# Patient Record
Sex: Female | Born: 1970 | Race: White | Hispanic: No | Marital: Single | Smoking: Never smoker
Health system: Southern US, Community
[De-identification: ages and names within clinical notes are randomized; demographics above are authoritative.]

## PROBLEM LIST (undated history)

## (undated) DIAGNOSIS — J45909 Unspecified asthma, uncomplicated: Secondary | ICD-10-CM

## (undated) DIAGNOSIS — T783XXA Angioneurotic edema, initial encounter: Secondary | ICD-10-CM

## (undated) HISTORY — PX: OTHER SURGICAL HISTORY: SHX169

## (undated) HISTORY — DX: Unspecified asthma, uncomplicated: J45.909

## (undated) HISTORY — DX: Angioneurotic edema, initial encounter: T78.3XXA

---

## 2014-11-17 ENCOUNTER — Encounter: Payer: Self-pay | Admitting: Allergy and Immunology

## 2014-11-17 ENCOUNTER — Ambulatory Visit (INDEPENDENT_AMBULATORY_CARE_PROVIDER_SITE_OTHER): Payer: BLUE CROSS/BLUE SHIELD | Admitting: Allergy and Immunology

## 2014-11-17 ENCOUNTER — Ambulatory Visit: Payer: Self-pay | Admitting: Allergy and Immunology

## 2014-11-17 VITALS — BP 112/74 | HR 68 | Temp 98.1°F | Resp 14 | Ht 62.21 in | Wt 229.7 lb

## 2014-11-17 DIAGNOSIS — J4540 Moderate persistent asthma, uncomplicated: Secondary | ICD-10-CM | POA: Diagnosis not present

## 2014-11-17 DIAGNOSIS — K219 Gastro-esophageal reflux disease without esophagitis: Secondary | ICD-10-CM

## 2014-11-17 DIAGNOSIS — J3089 Other allergic rhinitis: Secondary | ICD-10-CM | POA: Diagnosis not present

## 2014-11-17 MED ORDER — RANITIDINE HCL 300 MG PO TABS: 300.0000 mg | ORAL_TABLET | Freq: Every day | ORAL | Status: AC

## 2014-11-17 MED ORDER — ALBUTEROL SULFATE (2.5 MG/3ML) 0.083% IN NEBU
2.5000 mg | INHALATION_SOLUTION | Freq: Once | RESPIRATORY_TRACT | Status: AC
  Administered 2014-11-17: 2.5 mg via RESPIRATORY_TRACT

## 2014-11-17 MED ORDER — MOMETASONE FUROATE 200 MCG/ACT IN AERO: 2.0000 | INHALATION_SPRAY | Freq: Two times a day (BID) | RESPIRATORY_TRACT | Status: AC

## 2014-11-17 MED ORDER — MONTELUKAST SODIUM 10 MG PO TABS: 10.0000 mg | ORAL_TABLET | Freq: Every day | ORAL | Status: AC

## 2014-11-17 NOTE — Progress Notes (Signed)
[facility] [place]  New patient note    Subjective:   Natasha Stone is a 44 y.o. female who returns to the [facility] in re-evaluation of the following:  HPI Comments:  Natasha Stone has a history of developing and being evaluated for chest pain in August 2016. She apparently had a negative diagnostic evaluation and was started on therapy for asthma given her history of constant cough which has been an issue for year. Neither Dulera or a SABA has helped her with these issue. She complains about a dry cough and getting SOB if she exerts herself. Very little throat clearing or raspy voice. She has reflux up to her throat even on Nexium. Sometimes she needs to take Nexium twice a day for breakthrough symptoms. She drink caffeine daily. As well, she tells me that her oxygen level was very low when she was in the hospital and she was discharged with an O2 of 88% she has very restless sleep and wakes up all night and feels tired all day. She has a runny nose especially in the morning but no nasal congestion or sneezing or anosmia or headaches.    Current outpatient prescriptions:  .  albuterol (VENTOLIN HFA) 108 (90 BASE) MCG/ACT inhaler, Inhale 2 puffs into the lungs every 4 (four) hours as needed for wheezing or shortness of breath (PT IS USING AT LEAST TWICE A DAY)., Disp: , Rfl:  .  esomeprazole (NEXIUM) 40 MG capsule, Take 40 mg by mouth daily at 12 noon., Disp: , Rfl:  .  Mometasone Furoate (ASMANEX HFA) 200 MCG/ACT AERO, Inhale 2 puffs into the lungs 2 (two) times daily., Disp: 1 Inhaler, Rfl: 3 .  mometasone-formoterol (DULERA) 200-5 MCG/ACT AERO, Inhale 2 puffs into the lungs daily., Disp: , Rfl:  .  montelukast (SINGULAIR) 10 MG tablet, Take 1 tablet (10 mg total) by mouth at bedtime., Disp: 30 tablet, Rfl: 5 .  PROAIR HFA 108 (90 BASE) MCG/ACT inhaler, Inhale 2 puffs into the lungs 2 (two) times daily as needed., Disp: , Rfl:  0 .  ranitidine (ZANTAC) 300 MG tablet, Take 1 tablet (300 mg total) by mouth at bedtime., Disp: 30 tablet, Rfl: 5  Meds ordered this encounter  Medications  . albuterol (PROVENTIL) (2.5 MG/3ML) 0.083% nebulizer solution 2.5 mg    Sig:   . montelukast (SINGULAIR) 10 MG tablet    Sig: Take 1 tablet (10 mg total) by mouth at bedtime.    Dispense:  30 tablet    Refill:  5  . Mometasone Furoate (ASMANEX HFA) 200 MCG/ACT AERO    Sig: Inhale 2 puffs into the lungs 2 (two) times daily.    Dispense:  1 Inhaler    Refill:  3  . ranitidine (ZANTAC) 300 MG tablet    Sig: Take 1 tablet (300 mg total) by mouth at bedtime.    Dispense:  30 tablet    Refill:  5    Past Medical History  Diagnosis Date  . Angio-edema   . Asthma     Past Surgical History  Procedure Laterality Date  . 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 Morphine And Related Nausea And Vomiting    Review of Systems  Constitutional: Negative for fever, chills, weight loss and malaise/fatigue.  HENT: Positive for congestion. Negative for ear discharge, ear pain, hearing loss, nosebleeds, sore throat and tinnitus.  Eyes: Negative for blurred vision, pain, discharge and redness.  Respiratory: Positive for cough. Negative for hemoptysis, sputum production, shortness of breath, wheezing and stridor.   Cardiovascular: Negative for chest pain, palpitations and leg swelling.  Gastrointestinal: Positive for heartburn. Negative for nausea, vomiting, abdominal pain and diarrhea.  Genitourinary: Negative for dysuria.  Musculoskeletal: Negative for myalgias and joint pain.  Skin: Negative for itching and rash.  Neurological: Negative for dizziness and headaches.     Objective:   Filed Vitals:   11/17/14 1339  BP: 112/74  Pulse: 68  Temp: 98.1 F (36.7 C)  Resp: 14    Physical Exam  Constitutional: She is well-developed, well-nourished, and in no distress. No distress.  HENT:   Head: Normocephalic.  Right Ear: External ear normal.  Left Ear: External ear normal.  Nose: Nose normal.  Mouth/Throat: Oropharynx is clear and moist. No oropharyngeal exudate.  Eyes: Conjunctivae are normal. Pupils are equal, round, and reactive to light. Right eye exhibits no discharge. Left eye exhibits no discharge. No scleral icterus.  Neck: No tracheal deviation present. No thyromegaly present.  Cardiovascular: Normal rate, regular rhythm and normal heart sounds.  Exam reveals no gallop and no friction rub.   No murmur heard. Pulmonary/Chest: Effort normal and breath sounds normal. No respiratory distress. She has no wheezes. She has no rales. She exhibits no tenderness.  Abdominal: Soft. She exhibits no distension and no mass. There is no tenderness. There is no rebound and no guarding.  Musculoskeletal: She exhibits no edema or tenderness.  Lymphadenopathy:    She has no cervical adenopathy.  Neurological: She is alert. Gait normal.  Skin: No rash noted. She is not diaphoretic. No erythema. No pallor.  Psychiatric: Mood and affect normal.    Diagnostics:   Allergy skin tests were negative.   Spirometry was performed and demonstrated an FEV1 of 2.04 at 77 % of predicted. Following nebulized albuterol her FEVI rose 4%  The patient had an Asthma Control Test with the following results: ACT Total Score: 24.    SaO2 was 95% on room air  Assessment and Plan:   1. Moderate persistent asthma, uncomplicated   2. Other allergic rhinitis   3. Gastroesophageal reflux disease, esophagitis presence not specified        1. Allergen avoidance measures  2. Treat and prevent inflammation:   A. Montelukast 10mg  one tablet one time per day  B. OTC Rhinocort one spray each nostril one time per day  C. Asmanex 200 two inhalations two times per day (spacer)  3. Treat reflux:   A. Nexium 40 one tablet one time per ady in AM  B. Ranitidine 300mg  one tablet one time per day in  PM  C. Consolidate caffeine use slowly  4. If needed:   A. Ventolin 2 puffs every 4-6 hours if needed.  B. OTC antihistamine  5. Sleep study???  6. Return in three weeks.  7. Get a flu vaccine  8. Review Chest X-ray   Natasha Stone will use the plan above for three weeks and we make a decision about how to proceed pending her response. I suspect the biggest insult to her airway may be reflux. I do not know why her O2 was low during her hospitalization but given some of her other issues there may be a component of sllep apnea and obesity induced hypoventilation contributing to this issue.      Laurette SchimkeEric Jannelly Bergren, MD [facility]

## 2014-11-17 NOTE — Patient Instructions (Addendum)
  1. Allergen avoidance measures  2. Treat and prevent inflammation:   A. Montelukast 10mg  one tablet one time per day  B. OTC Rhinocort one spray each nostril one time per day  C. Asmanex 200 two inhalations two times per day (spacer)  3. Treat reflux:   A. Nexium 40 one tablet one time per ady in AM  B. Ranitidine 300mg  one tablet one time per day in PM  C. Consolidate caffeine use slowly  4. If needed:   A. Ventolin 2 puffs every 4-6 hours if needed.  B. OTC antihistamine  5. Sleep study???  6. Return in three weeks.  7. Get a flu vaccine  8. Review Chest X-ray

## 2014-12-08 ENCOUNTER — Encounter: Payer: Self-pay | Admitting: Allergy and Immunology

## 2014-12-08 ENCOUNTER — Ambulatory Visit (INDEPENDENT_AMBULATORY_CARE_PROVIDER_SITE_OTHER): Payer: BLUE CROSS/BLUE SHIELD | Admitting: Allergy and Immunology

## 2014-12-08 VITALS — BP 100/68 | HR 80 | Resp 16

## 2014-12-08 DIAGNOSIS — J3089 Other allergic rhinitis: Secondary | ICD-10-CM | POA: Diagnosis not present

## 2014-12-08 DIAGNOSIS — J4540 Moderate persistent asthma, uncomplicated: Secondary | ICD-10-CM

## 2014-12-08 DIAGNOSIS — K219 Gastro-esophageal reflux disease without esophagitis: Secondary | ICD-10-CM

## 2014-12-08 NOTE — Patient Instructions (Signed)
     1. Treat and prevent inflammation:   A. Montelukast 10mg  one tablet one time per day  B. OTC Rhinocort one spray each nostril one time per day  C. Dcrease Asmanex 200 to ONE inhalations ONE time per day (spacer)  2. Treat reflux:   A. Nexium 40 one tablet one time per ady in AM  B. Ranitidine 300mg  one tablet one time per day in PM  C. Consolidate caffeine use slowly  3. If needed:   A. Ventolin 2 puffs every 4-6 hours if needed.  B. OTC antihistamine  4. Return in January 2017   5. Get a flu vaccine

## 2014-12-08 NOTE — Progress Notes (Signed)
[facility] [place]  Follow-up Note  Refering Provider: Konrad Felix, MD Primary Provider: Konrad Felix, MD  Subjective:   Natasha Stone is a 44 y.o. female who returns to the [facility] and [facility] Center in re-evaluation of the following:  HPI Comments:  Natasha Stone returns to this clinic in reevaluation of her respiratory tract problems. She has basically resolved all her cough. She can exercise without any problem. She's not been having a tremendous amount of upper airway symptoms although she did develop a episode of sneezing about 2 weeks ago which has since resolved. She is no longer regurgitating. She's not tired all day although around 8 or 9:00 in the evening she does feel very tired she goes to bed around 9:30 or 10 and awakens around 6:00 or so. She has very little throat clearing and no raspy voice. She's had no classic reflux symptoms. Natasha Stone is been very good about consistently using her medical therapy which includes a combination of montelukast, Rhinocort, and Asmanex and Nexium and ranitidine she is consolidated her coffee to one cup of tea per day.   Outpatient Encounter Prescriptions as of 12/08/2014  Medication Sig  . cetirizine (ZYRTEC) 10 MG tablet Take 10 mg by mouth at bedtime.  Marland Kitchen esomeprazole (NEXIUM) 40 MG capsule Take 40 mg by mouth daily at 12 noon.  . Mometasone Furoate (ASMANEX HFA) 200 MCG/ACT AERO Inhale 2 puffs into the lungs 2 (two) times daily.  . montelukast (SINGULAIR) 10 MG tablet Take 1 tablet (10 mg total) by mouth at bedtime.  Marland Kitchen PROAIR HFA 108 (90 BASE) MCG/ACT inhaler Inhale 2 puffs into the lungs 2 (two) times daily as needed.  . ranitidine (ZANTAC) 300 MG tablet Take 1 tablet (300 mg total) by mouth at bedtime.  . SUMAtriptan (IMITREX) 50 MG tablet Take 50 mg by mouth.  Marland Kitchen albuterol (VENTOLIN HFA) 108 (90 BASE) MCG/ACT inhaler Inhale 2 puffs into the lungs every 4 (four) hours as needed for wheezing or  shortness of breath (PT IS USING AT LEAST TWICE A DAY).  Marland Kitchen mometasone-formoterol (DULERA) 200-5 MCG/ACT AERO Inhale 2 puffs into the lungs daily.   No facility-administered encounter medications on file as of 12/08/2014.    No orders of the defined types were placed in this encounter.    Past Medical History  Diagnosis Date  . Angio-edema   . Asthma     Past Surgical History  Procedure Laterality Date  . 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 Morphine And Related Nausea And Vomiting    Review of Systems  Constitutional: Negative for fever.  HENT: Negative for congestion, ear pain, facial swelling, nosebleeds, postnasal drip, rhinorrhea, sinus pressure, sneezing, sore throat, trouble swallowing and voice change.   Eyes: Negative for pain, discharge, redness and itching.  Respiratory: Negative for cough, choking, chest tightness, shortness of breath, wheezing and stridor.   Cardiovascular: Negative for chest pain and leg swelling.  Gastrointestinal: Negative for nausea, vomiting and abdominal pain.  Endocrine: Negative for cold intolerance and heat intolerance.  Musculoskeletal: Negative for myalgias and arthralgias.  Allergic/Immunologic: Negative.   Neurological: Negative for dizziness.     Objective:   Filed Vitals:   12/08/14 1521  BP: 100/68  Pulse: 80  Resp: 16          Physical Exam  Constitutional: She appears well-developed and well-nourished. No distress.  HENT:  Head: Normocephalic and  atraumatic. Head is without right periorbital erythema and without left periorbital erythema.  Right Ear: Tympanic membrane, external ear and ear canal normal. No drainage or tenderness. No foreign bodies. Tympanic membrane is not injected, not scarred, not perforated, not erythematous, not retracted and not bulging. No middle ear effusion.  Left Ear: Tympanic membrane, external ear and ear canal normal. No drainage or tenderness.  No foreign bodies. Tympanic membrane is not injected, not scarred, not perforated, not erythematous, not retracted and not bulging.  No middle ear effusion.  Nose: Nose normal. No mucosal edema, rhinorrhea, nose lacerations or sinus tenderness.  No foreign bodies.  Mouth/Throat: Oropharynx is clear and moist. No oropharyngeal exudate, posterior oropharyngeal edema, posterior oropharyngeal erythema or tonsillar abscesses.  Eyes: Lids are normal. Right eye exhibits no chemosis, no discharge and no exudate. No foreign body present in the right eye. Left eye exhibits no chemosis, no discharge and no exudate. No foreign body present in the left eye. Right conjunctiva is not injected. Left conjunctiva is not injected.  Neck: Neck supple. No tracheal tenderness present. No tracheal deviation and no edema present. No thyroid mass and no thyromegaly present.  Cardiovascular: Normal rate, regular rhythm, S1 normal and S2 normal.  Exam reveals no gallop.   No murmur heard. Pulmonary/Chest: No accessory muscle usage or stridor. No respiratory distress. She has no wheezes. She has no rhonchi. She has no rales.  Abdominal: Soft. There is no hepatosplenomegaly. There is no rigidity.  Lymphadenopathy:       Head (right side): No tonsillar adenopathy present.       Head (left side): No tonsillar adenopathy present.    She has no cervical adenopathy.  Neurological: She is alert.  Skin: No rash noted. She is not diaphoretic.  Psychiatric: She has a normal mood and affect. Her behavior is normal.    Diagnostics:    Spirometry was performed and demonstrated an FEV1 of 1.95 at 76 % of predicted.  The patient had an Asthma Control Test with the following results: ACT Total Score: 22.    Assessment and Plan:   1. Moderate persistent asthma, uncomplicated   2. Other allergic rhinitis   3. Gastroesophageal reflux disease, esophagitis presence not specified       1. Treat and prevent inflammation:   A.  Montelukast  one tablet one time per day  B. OTC Rhinocort one spray each nostril one time per day  C. Dcrease Asmanex 200 to ONE inhalations ONE time per day (spacer)  2. Treat reflux:   A. Nexium 40 one tablet one time per ady in AM  B. Ranitidine  one tablet one time per day in PM  C. Consolidate caffeine use slowly  3. If needed:   A. Ventolin 2 puffs every 4-6 hours if needed.  B. OTC antihistamine  4. Return in January 2017   5. Get a flu vaccine  Ginni has done very well on her current medical therapy and we will see if we can consolidate her Asmanex to one inhalation 1 time per day at the same time we continue to have her use other medications including therapy directed against reflux. She'll keep in contact with me noting her response to the a for mentioned therapy. We'll make a determination about what type of evaluation or treatment she'll require when we regroup in January. 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       Laurette SchimkeEric Ankur Snowdon, MD [facility]

## 2014-12-10 ENCOUNTER — Encounter: Payer: Self-pay | Admitting: Allergy and Immunology

## 2015-02-10 ENCOUNTER — Ambulatory Visit: Payer: BLUE CROSS/BLUE SHIELD | Admitting: Allergy and Immunology

## 2018-02-10 ENCOUNTER — Other Ambulatory Visit: Payer: Self-pay | Admitting: Family

## 2018-02-10 DIAGNOSIS — R109 Unspecified abdominal pain: Secondary | ICD-10-CM

## 2018-02-17 ENCOUNTER — Ambulatory Visit
Admission: RE | Admit: 2018-02-17 | Discharge: 2018-02-17 | Disposition: A | Discharge status: Home / Self Care | Payer: 59 | Source: Ambulatory Visit | Attending: Internal Medicine | Admitting: Internal Medicine

## 2018-02-17 ENCOUNTER — Other Ambulatory Visit: Payer: Self-pay | Admitting: Family

## 2018-02-17 DIAGNOSIS — R109 Unspecified abdominal pain: Secondary | ICD-10-CM

## 2020-01-08 IMAGING — CT CT ABD-PELV W/O
2 of 3 series · 12 of 32 positions shown, 17 images · non-contrast
Comparison: None.

CLINICAL DATA: Palpable abdominal mass

EXAM:
CT ABDOMEN AND PELVIS WITHOUT CONTRAST
TECHNIQUE: Multidetector CT imaging of the abdomen and pelvis was performed
following the standard protocol without IV contrast. Oral contrast
was administered.

[Series 2: abd w/(date) · axial · 0.91mm/px · z∈[-292,-28]mm · 8 of 69 slices shown, 13 images]
[im 8/69  soft-tissue]
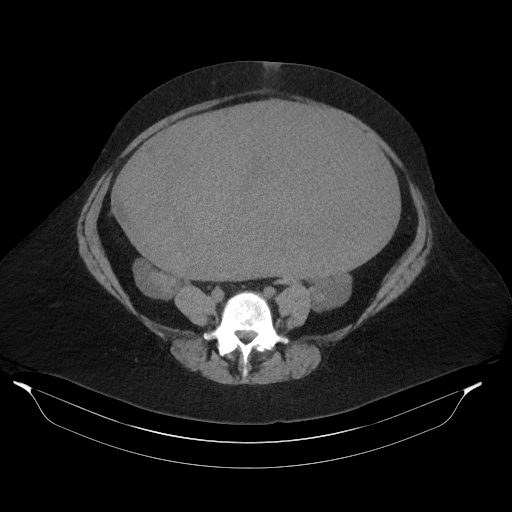
[im 8/69  bone]
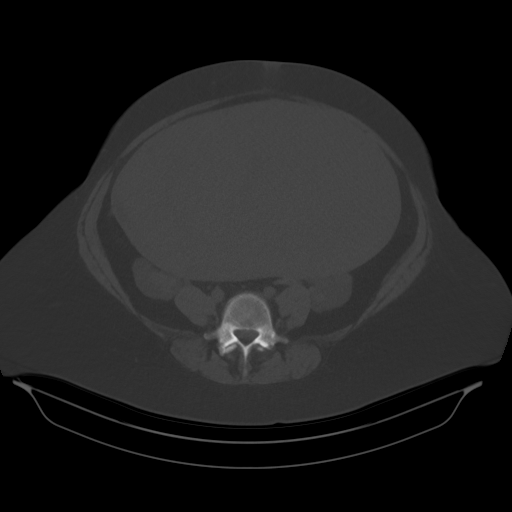
[im 16/69  soft-tissue]
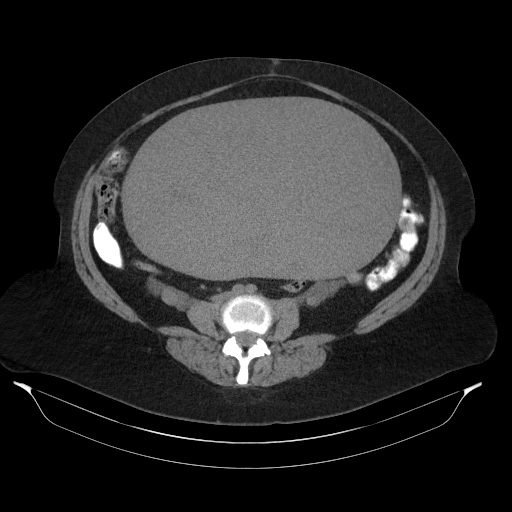
[im 23/69  soft-tissue]
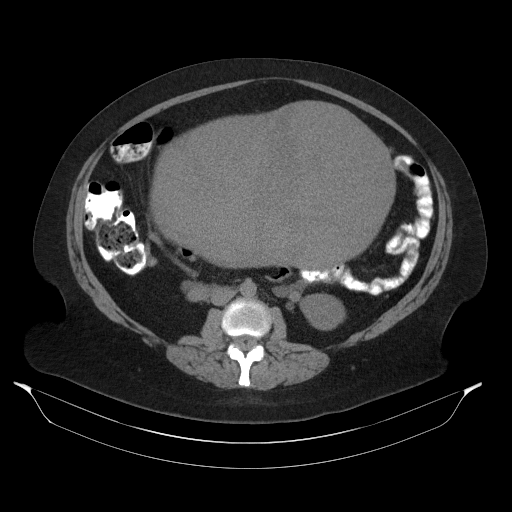
[im 31/69  soft-tissue]
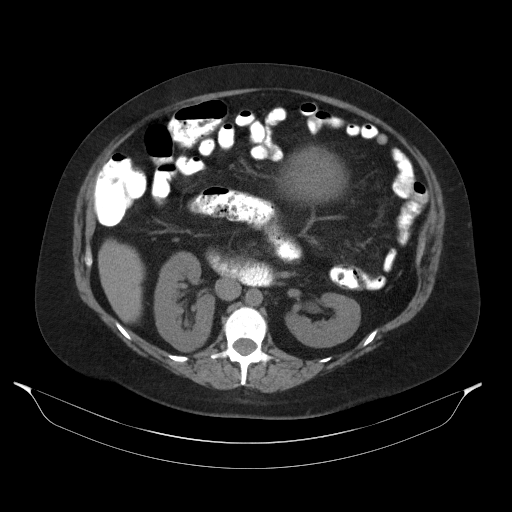
[im 38/69  soft-tissue]
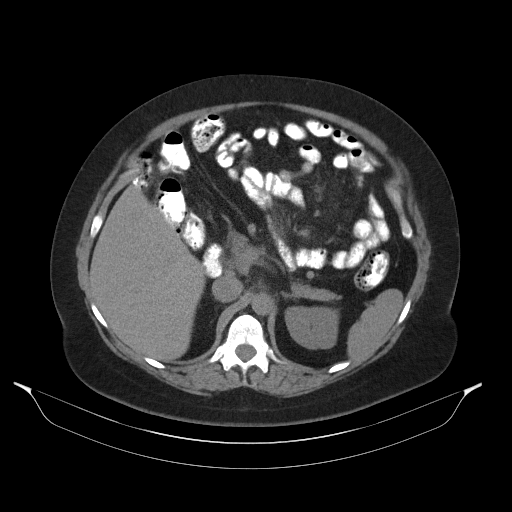
[im 38/69  lung]
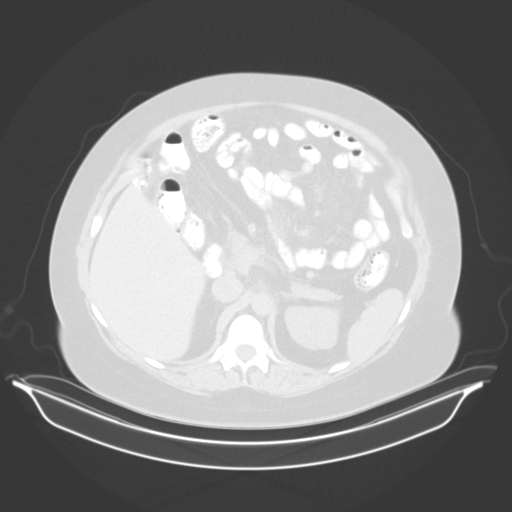
[im 46/69  soft-tissue]
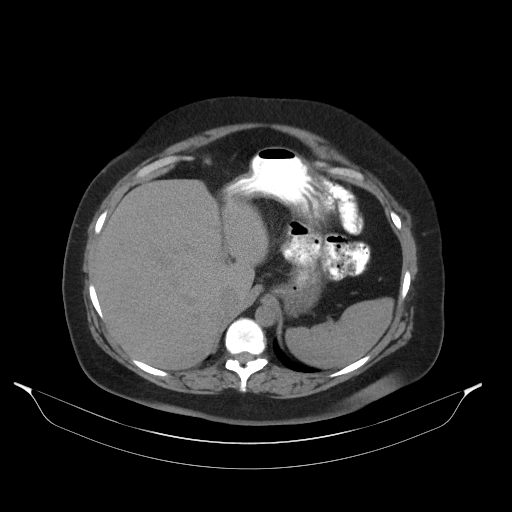
[im 46/69  lung]
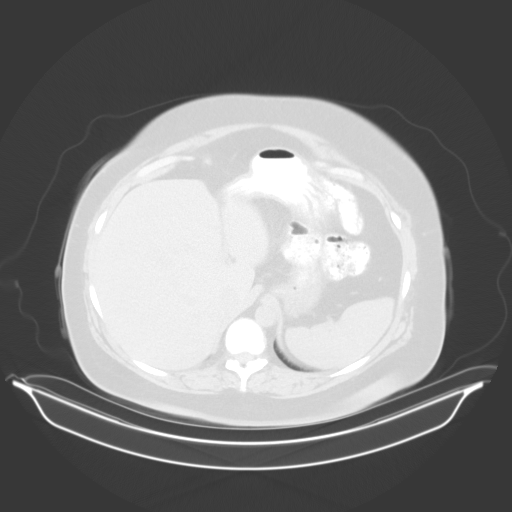
[im 53/69  soft-tissue]
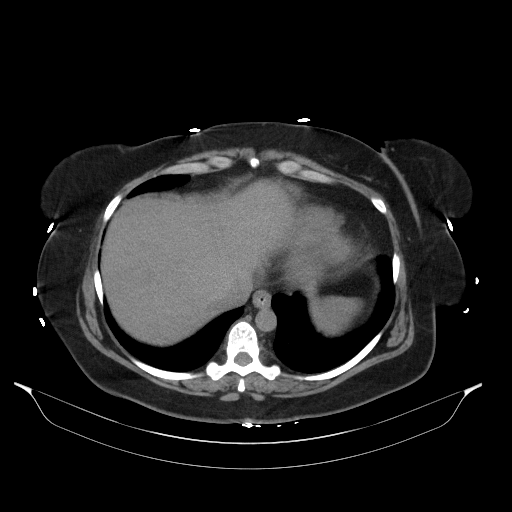
[im 53/69  lung]
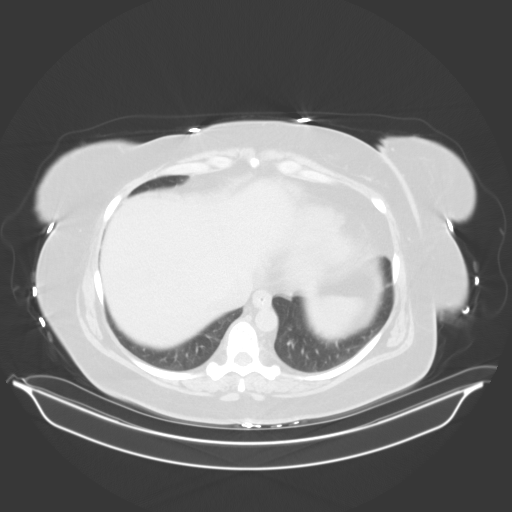
[im 61/69  soft-tissue]
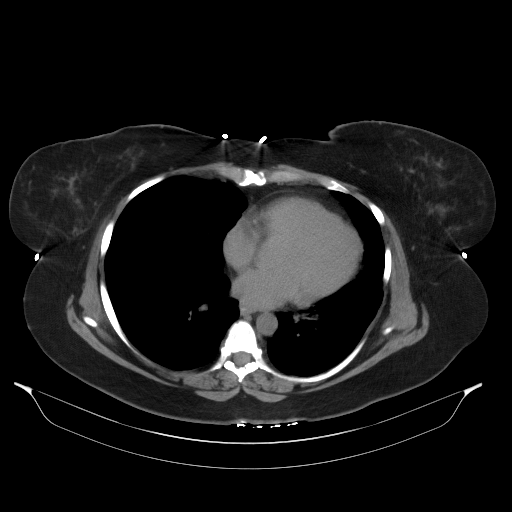
[im 61/69  lung]
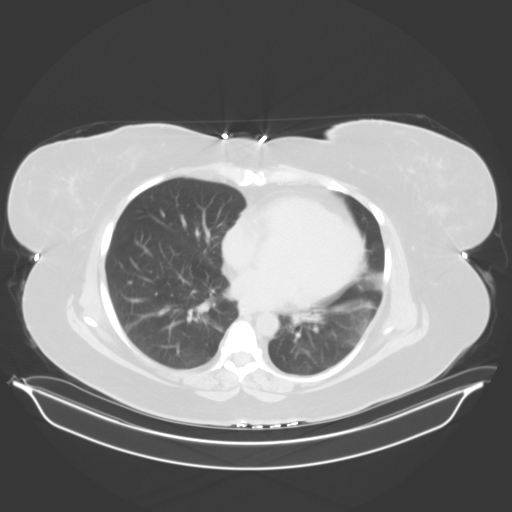

[Series 4: routine pelvis w/(date) · axial · 0.98mm/px · z∈[-489,-389]mm · 4 of 61 slices shown]
[im 7/61  soft-tissue]
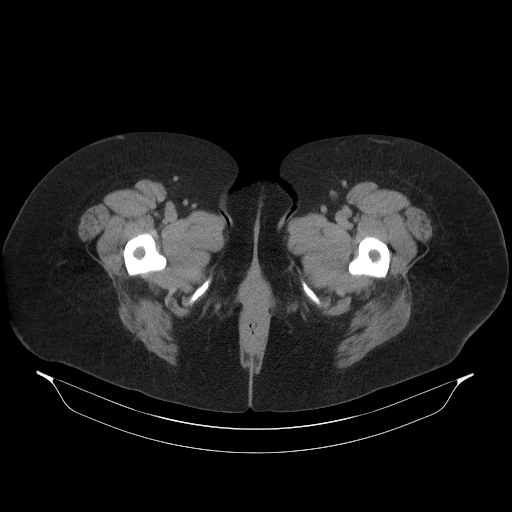
[im 14/61  soft-tissue]
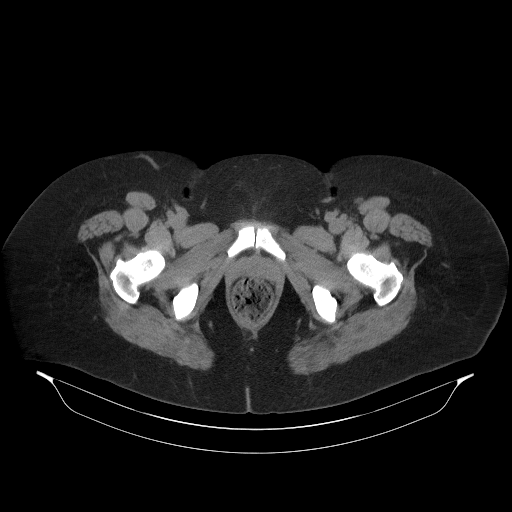
[im 21/61  soft-tissue]
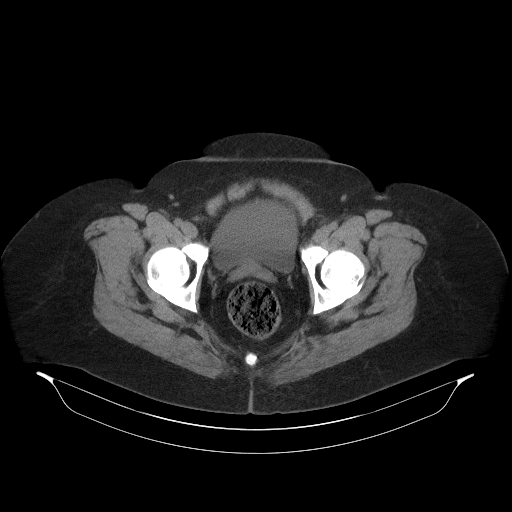
[im 27/61  soft-tissue]
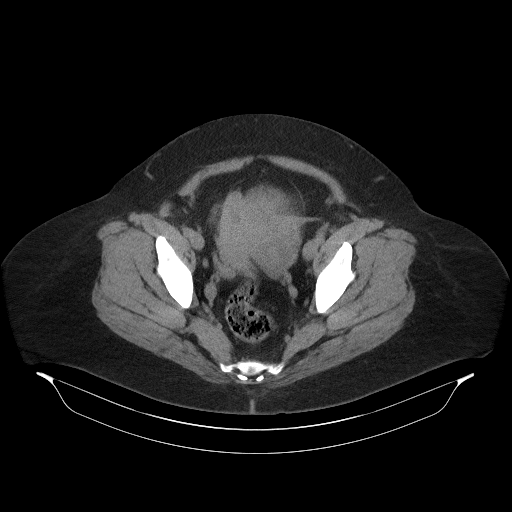

[12 of 32 positions shown; findings below may reference images not displayed]

FINDINGS: Lower chest: There is atelectatic change in each lower lung zone.
There is a nodular opacity abutting the pleura in the lateral
segment of the right lower lobe measuring 5 mm. This nodular opacity
is best seen on axial slice 17 series 7. A nodular opacity abutting
the pleura in the superior segment of the right lower lobe measures
4 mm. This nodular opacity is best seen on axial slice 19 series 7.

Hepatobiliary: No focal liver lesions are appreciable on this
noncontrast enhanced study. Gallbladder wall is not appreciably
thickened. There is no appreciable biliary duct dilatation.

Pancreas: No pancreatic mass or inflammatory focus.

Spleen: No splenic lesions are evident.

Adrenals/Urinary Tract: Adrenals bilaterally appear normal. Kidneys
bilaterally show no evident mass or hydronephrosis on either side.
There is no renal or ureteral calculus on either side. Several
phleboliths are noted adjacent to the distal ureters but appearing
separate from the distal ureters. Urinary bladder is midline with
wall thickness within normal limits.

Stomach/Bowel: There is no appreciable bowel wall or mesenteric
thickening. There is no evident bowel obstruction. There is no free
air or portal venous air.

Vascular/Lymphatic: There is no abdominal aortic aneurysm. No
vascular lesions are evident on this noncontrast enhanced study.
There is no appreciable adenopathy in the abdomen or pelvis.

Reproductive: Uterus is anteverted. There is a homogeneous solid
mass which appears to arise eccentrically from the uterus measuring
approximately 22 cm from superior to inferior dimension, 26.1 cm
from right to left dimension, and 16.4 cm from anterior to posterior
dimension. This large mass displaces bowel but does not invade or
obstructed bowel. This mass most likely represents a large uterine
leiomyoma. The ovaries appear separate from this mass and within
normal limits by CT.

Other: Appendix appears normal. There is no evident abscess or
ascites in the abdomen or pelvis. There is a small ventral hernia
containing only fat.

Musculoskeletal: There are no blastic or lytic bone lesions. There
is no intramuscular lesion evident.
IMPRESSION: 1. Mass arising from the anterior uterus measuring approximately 22
x 26.1 x 16.4 cm, an apparent large uterine leiomyoma. Adjacent
ovaries appear unremarkable by CT.

2.  No adenopathy evident.

3. No evident bowel obstruction. No abscess in the abdomen or
pelvis. Appendix appears normal.

4. No evident renal or ureteral calculus. No appreciable
hydronephrosis.

5.  Small ventral hernia containing only fat.

6.  Gallbladder absent.

7. Small nodular opacities in the right lower lobe, largest
measuring 5 mm. No follow-up needed if patient is low-risk (and has
no known or suspected primary neoplasm). Non-contrast chest CT can
be considered in 12 months if patient is high-risk. This
recommendation follows the consensus statement: Guidelines for
Management of Incidental Pulmonary Nodules Detected on CT Images:

## 2020-10-06 DEATH — deceased

## 2022-11-10 ENCOUNTER — Other Ambulatory Visit (HOSPITAL_BASED_OUTPATIENT_CLINIC_OR_DEPARTMENT_OTHER): Payer: Self-pay
